# Patient Record
Sex: Female | Born: 1995 | Hispanic: No | Marital: Single | Smoking: Never smoker
Health system: Southern US, Community
[De-identification: ages and names within clinical notes are randomized; demographics above are authoritative.]

## PROBLEM LIST (undated history)

## (undated) DIAGNOSIS — E282 Polycystic ovarian syndrome: Secondary | ICD-10-CM

---

## 2017-11-07 DIAGNOSIS — F4310 Post-traumatic stress disorder, unspecified: Secondary | ICD-10-CM

## 2017-11-07 DIAGNOSIS — F330 Major depressive disorder, recurrent, mild: Secondary | ICD-10-CM

## 2017-11-07 HISTORY — DX: Major depressive disorder, recurrent, mild: F33.0

## 2017-11-07 HISTORY — DX: Post-traumatic stress disorder, unspecified: F43.10

## 2019-11-05 LAB — OB RESULTS CONSOLE HEPATITIS B SURFACE ANTIGEN: Hepatitis B Surface Ag: NEGATIVE

## 2019-11-05 LAB — OB RESULTS CONSOLE HIV ANTIBODY (ROUTINE TESTING): HIV: NONREACTIVE

## 2019-11-05 LAB — OB RESULTS CONSOLE RUBELLA ANTIBODY, IGM: Rubella: IMMUNE

## 2020-01-07 DIAGNOSIS — Z131 Encounter for screening for diabetes mellitus: Secondary | ICD-10-CM | POA: Diagnosis not present

## 2020-01-22 ENCOUNTER — Encounter: Payer: Self-pay | Admitting: Emergency Medicine

## 2020-01-22 ENCOUNTER — Emergency Department: Payer: Medicaid Other

## 2020-01-22 ENCOUNTER — Other Ambulatory Visit: Payer: Self-pay

## 2020-01-22 DIAGNOSIS — Z3A17 17 weeks gestation of pregnancy: Secondary | ICD-10-CM | POA: Diagnosis not present

## 2020-01-22 DIAGNOSIS — Z5321 Procedure and treatment not carried out due to patient leaving prior to being seen by health care provider: Secondary | ICD-10-CM | POA: Insufficient documentation

## 2020-01-22 DIAGNOSIS — O26892 Other specified pregnancy related conditions, second trimester: Secondary | ICD-10-CM | POA: Diagnosis not present

## 2020-01-22 DIAGNOSIS — R1031 Right lower quadrant pain: Secondary | ICD-10-CM | POA: Insufficient documentation

## 2020-01-22 LAB — COMPREHENSIVE METABOLIC PANEL
ALT: 10 U/L (ref 0–44)
AST: 16 U/L (ref 15–41)
Albumin: 3.2 g/dL — ABNORMAL LOW (ref 3.5–5.0)
Alkaline Phosphatase: 45 U/L (ref 38–126)
Anion gap: 6 (ref 5–15)
BUN: 5 mg/dL — ABNORMAL LOW (ref 6–20)
CO2: 24 mmol/L (ref 22–32)
Calcium: 8.7 mg/dL — ABNORMAL LOW (ref 8.9–10.3)
Chloride: 103 mmol/L (ref 98–111)
Creatinine, Ser: 0.43 mg/dL — ABNORMAL LOW (ref 0.44–1.00)
GFR, Estimated: >60 mL/min (ref >60)
Glucose, Bld: 94 mg/dL (ref 70–99)
Potassium: 4.1 mmol/L (ref 3.5–5.1)
Sodium: 133 mmol/L — ABNORMAL LOW (ref 135–145)
Total Bilirubin: 0.5 mg/dL (ref 0.3–1.2)
Total Protein: 7.1 g/dL (ref 6.5–8.1)

## 2020-01-22 LAB — URINALYSIS, COMPLETE (UACMP) WITH MICROSCOPIC
Bacteria, UA: NONE SEEN
Bilirubin Urine: NEGATIVE
Glucose, UA: NEGATIVE mg/dL
Hgb urine dipstick: NEGATIVE
Ketones, ur: NEGATIVE mg/dL
Leukocytes,Ua: NEGATIVE
Nitrite: NEGATIVE
Protein, ur: NEGATIVE mg/dL
Specific Gravity, Urine: 1.016 (ref 1.005–1.030)
pH: 8 (ref 5.0–8.0)

## 2020-01-22 LAB — CBC
HCT: 35.4 % — ABNORMAL LOW (ref 36.0–46.0)
Hemoglobin: 12.2 g/dL (ref 12.0–15.0)
MCH: 31.6 pg (ref 26.0–34.0)
MCHC: 34.5 g/dL (ref 30.0–36.0)
MCV: 91.7 fL (ref 80.0–100.0)
Platelets: 255 10*3/uL (ref 150–400)
RBC: 3.86 MIL/uL — ABNORMAL LOW (ref 3.87–5.11)
RDW: 13.9 % (ref 11.5–15.5)
WBC: 8.8 10*3/uL (ref 4.0–10.5)
nRBC: 0 % (ref 0.0–0.2)

## 2020-01-22 NOTE — ED Triage Notes (Signed)
Pt in with co RLQ pain that started today, pt is [redacted] weeks pregnant but not having any vaginal bleeding or fluid leaking. Pt does have hx of PCOS and was told by OB to increase fluid intake.

## 2020-01-22 NOTE — ED Notes (Signed)
Attempted to obtain FHTs by this nurse and Shirlee More RN without success; OB u/s ordered to verify

## 2020-01-23 ENCOUNTER — Emergency Department
Admission: EM | Admit: 2020-01-23 | Discharge: 2020-01-23 | Disposition: A | Discharge status: Home / Self Care | Payer: Medicaid Other | Attending: Emergency Medicine | Admitting: Emergency Medicine

## 2020-01-23 HISTORY — DX: Polycystic ovarian syndrome: E28.2

## 2020-02-20 DIAGNOSIS — Z3689 Encounter for other specified antenatal screening: Secondary | ICD-10-CM | POA: Diagnosis not present

## 2020-02-20 DIAGNOSIS — Z1379 Encounter for other screening for genetic and chromosomal anomalies: Secondary | ICD-10-CM | POA: Diagnosis not present

## 2020-02-20 DIAGNOSIS — Z3482 Encounter for supervision of other normal pregnancy, second trimester: Secondary | ICD-10-CM | POA: Diagnosis not present

## 2020-03-05 DIAGNOSIS — Z3A24 24 weeks gestation of pregnancy: Secondary | ICD-10-CM | POA: Diagnosis not present

## 2020-03-05 DIAGNOSIS — O26879 Cervical shortening, unspecified trimester: Secondary | ICD-10-CM | POA: Diagnosis not present

## 2020-03-05 DIAGNOSIS — O283 Abnormal ultrasonic finding on antenatal screening of mother: Secondary | ICD-10-CM | POA: Diagnosis not present

## 2020-03-05 DIAGNOSIS — O0992 Supervision of high risk pregnancy, unspecified, second trimester: Secondary | ICD-10-CM | POA: Diagnosis not present

## 2020-03-06 DIAGNOSIS — Z3686 Encounter for antenatal screening for cervical length: Secondary | ICD-10-CM | POA: Diagnosis not present

## 2020-03-07 DIAGNOSIS — O09212 Supervision of pregnancy with history of pre-term labor, second trimester: Secondary | ICD-10-CM | POA: Diagnosis not present

## 2020-03-19 DIAGNOSIS — Z3402 Encounter for supervision of normal first pregnancy, second trimester: Secondary | ICD-10-CM | POA: Diagnosis not present

## 2020-03-19 DIAGNOSIS — Z3A26 26 weeks gestation of pregnancy: Secondary | ICD-10-CM | POA: Diagnosis not present

## 2020-03-26 ENCOUNTER — Observation Stay: Payer: Medicaid Other | Admitting: Anesthesiology

## 2020-03-26 ENCOUNTER — Encounter
Admission: EM | Disposition: A | Discharge status: Home / Self Care | Payer: Self-pay | Source: Home / Self Care | Attending: Obstetrics & Gynecology

## 2020-03-26 ENCOUNTER — Encounter: Payer: Self-pay | Admitting: Obstetrics & Gynecology

## 2020-03-26 ENCOUNTER — Observation Stay: Payer: Medicaid Other

## 2020-03-26 ENCOUNTER — Other Ambulatory Visit: Payer: Self-pay

## 2020-03-26 ENCOUNTER — Inpatient Hospital Stay
Admission: EM | Admit: 2020-03-26 | Discharge: 2020-03-28 | DRG: 786 | Disposition: A | Discharge status: Home / Self Care | Payer: Medicaid Other | Attending: Obstetrics & Gynecology | Admitting: Obstetrics & Gynecology

## 2020-03-26 DIAGNOSIS — O328XX0 Maternal care for other malpresentation of fetus, not applicable or unspecified: Secondary | ICD-10-CM | POA: Diagnosis present

## 2020-03-26 DIAGNOSIS — U071 COVID-19: Secondary | ICD-10-CM

## 2020-03-26 DIAGNOSIS — D62 Acute posthemorrhagic anemia: Secondary | ICD-10-CM | POA: Diagnosis not present

## 2020-03-26 DIAGNOSIS — O9852 Other viral diseases complicating childbirth: Secondary | ICD-10-CM | POA: Diagnosis present

## 2020-03-26 DIAGNOSIS — O9081 Anemia of the puerperium: Secondary | ICD-10-CM | POA: Diagnosis present

## 2020-03-26 DIAGNOSIS — O26872 Cervical shortening, second trimester: Secondary | ICD-10-CM

## 2020-03-26 DIAGNOSIS — Z3A27 27 weeks gestation of pregnancy: Secondary | ICD-10-CM

## 2020-03-26 DIAGNOSIS — O98512 Other viral diseases complicating pregnancy, second trimester: Secondary | ICD-10-CM

## 2020-03-26 DIAGNOSIS — O4692 Antepartum hemorrhage, unspecified, second trimester: Secondary | ICD-10-CM | POA: Diagnosis not present

## 2020-03-26 DIAGNOSIS — O321XX0 Maternal care for breech presentation, not applicable or unspecified: Secondary | ICD-10-CM | POA: Diagnosis not present

## 2020-03-26 DIAGNOSIS — O98513 Other viral diseases complicating pregnancy, third trimester: Secondary | ICD-10-CM | POA: Diagnosis present

## 2020-03-26 DIAGNOSIS — O4592 Premature separation of placenta, unspecified, second trimester: Secondary | ICD-10-CM | POA: Diagnosis not present

## 2020-03-26 DIAGNOSIS — O4693 Antepartum hemorrhage, unspecified, third trimester: Secondary | ICD-10-CM | POA: Diagnosis present

## 2020-03-26 LAB — URINE DRUG SCREEN, QUALITATIVE (ARMC ONLY)
Amphetamines, Ur Screen: NOT DETECTED
Barbiturates, Ur Screen: NOT DETECTED
Benzodiazepine, Ur Scrn: NOT DETECTED
Cannabinoid 50 Ng, Ur ~~LOC~~: NOT DETECTED
Cocaine Metabolite,Ur ~~LOC~~: NOT DETECTED
MDMA (Ecstasy)Ur Screen: NOT DETECTED
Methadone Scn, Ur: NOT DETECTED
Opiate, Ur Screen: NOT DETECTED
Phencyclidine (PCP) Ur S: NOT DETECTED
Tricyclic, Ur Screen: NOT DETECTED

## 2020-03-26 LAB — COMPREHENSIVE METABOLIC PANEL
ALT: 19 U/L (ref 0–44)
AST: 22 U/L (ref 15–41)
Albumin: 2.6 g/dL — ABNORMAL LOW (ref 3.5–5.0)
Alkaline Phosphatase: 63 U/L (ref 38–126)
Anion gap: 11 (ref 5–15)
BUN: <5 mg/dL — ABNORMAL LOW (ref 6–20)
CO2: 19 mmol/L — ABNORMAL LOW (ref 22–32)
Calcium: 8.2 mg/dL — ABNORMAL LOW (ref 8.9–10.3)
Chloride: 103 mmol/L (ref 98–111)
Creatinine, Ser: 0.5 mg/dL (ref 0.44–1.00)
GFR, Estimated: >60 mL/min (ref >60)
Glucose, Bld: 95 mg/dL (ref 70–99)
Potassium: 3.2 mmol/L — ABNORMAL LOW (ref 3.5–5.1)
Sodium: 133 mmol/L — ABNORMAL LOW (ref 135–145)
Total Bilirubin: 0.8 mg/dL (ref 0.3–1.2)
Total Protein: 6.5 g/dL (ref 6.5–8.1)

## 2020-03-26 LAB — ABO/RH: ABO/RH(D): A POS

## 2020-03-26 LAB — CBC
HCT: 29.3 % — ABNORMAL LOW (ref 36.0–46.0)
Hemoglobin: 9.6 g/dL — ABNORMAL LOW (ref 12.0–15.0)
MCH: 30 pg (ref 26.0–34.0)
MCHC: 32.8 g/dL (ref 30.0–36.0)
MCV: 91.6 fL (ref 80.0–100.0)
Platelets: 194 10*3/uL (ref 150–400)
RBC: 3.2 MIL/uL — ABNORMAL LOW (ref 3.87–5.11)
RDW: 13.9 % (ref 11.5–15.5)
WBC: 9.3 10*3/uL (ref 4.0–10.5)
nRBC: 0 % (ref 0.0–0.2)

## 2020-03-26 LAB — CHLAMYDIA/NGC RT PCR (ARMC ONLY)
Chlamydia Tr: NOT DETECTED
N gonorrhoeae: NOT DETECTED

## 2020-03-26 LAB — RESP PANEL BY RT-PCR (FLU A&B, COVID) ARPGX2
Influenza A by PCR: NEGATIVE
Influenza B by PCR: NEGATIVE
SARS Coronavirus 2 by RT PCR: POSITIVE — AB

## 2020-03-26 LAB — WET PREP, GENITAL
Clue Cells Wet Prep HPF POC: NONE SEEN
Sperm: NONE SEEN
Trich, Wet Prep: NONE SEEN
Yeast Wet Prep HPF POC: NONE SEEN

## 2020-03-26 LAB — TYPE AND SCREEN
ABO/RH(D): A POS
Antibody Screen: NEGATIVE

## 2020-03-26 SURGERY — Surgical Case
Anesthesia: Spinal

## 2020-03-26 MED ORDER — SOD CITRATE-CITRIC ACID 500-334 MG/5ML PO SOLN
ORAL | Status: AC
  Administered 2020-03-26: 30 mL
  Filled 2020-03-26: qty 15

## 2020-03-26 MED ORDER — OXYCODONE HCL 5 MG PO TABS
5.0000 mg | ORAL_TABLET | ORAL | Status: DC | PRN
Start: 2020-03-26 — End: 2020-03-28
  Administered 2020-03-26 (×2): 5 mg via ORAL
  Administered 2020-03-27: 10 mg via ORAL
  Filled 2020-03-26: qty 1
  Filled 2020-03-26 (×3): qty 2

## 2020-03-26 MED ORDER — SODIUM CHLORIDE 0.9% FLUSH: 3.0000 mL | INTRAVENOUS | Status: DC | PRN

## 2020-03-26 MED ORDER — DIBUCAINE (PERIANAL) 1 % EX OINT
1.0000 | TOPICAL_OINTMENT | CUTANEOUS | Status: DC | PRN
Start: 2020-03-26 — End: 2020-03-28

## 2020-03-26 MED ORDER — SENNOSIDES-DOCUSATE SODIUM 8.6-50 MG PO TABS
2.0000 | ORAL_TABLET | ORAL | Status: DC
  Administered 2020-03-26: 2 via ORAL
  Filled 2020-03-26 (×2): qty 2

## 2020-03-26 MED ORDER — LACTATED RINGERS IV SOLN: INTRAVENOUS | Status: DC | PRN

## 2020-03-26 MED ORDER — FENTANYL CITRATE (PF) 100 MCG/2ML IJ SOLN
INTRAMUSCULAR | Status: DC | PRN
  Administered 2020-03-26 (×2): 50 ug via INTRAVENOUS

## 2020-03-26 MED ORDER — ACETAMINOPHEN 500 MG PO TABS
ORAL_TABLET | ORAL | Status: AC
  Filled 2020-03-26: qty 2

## 2020-03-26 MED ORDER — WITCH HAZEL-GLYCERIN EX PADS: 1.0000 "application " | MEDICATED_PAD | CUTANEOUS | Status: DC | PRN

## 2020-03-26 MED ORDER — OXYTOCIN-SODIUM CHLORIDE 30-0.9 UT/500ML-% IV SOLN
INTRAVENOUS | Status: DC | PRN
  Administered 2020-03-26: 600 mL/h via INTRAVENOUS

## 2020-03-26 MED ORDER — KETOROLAC TROMETHAMINE 30 MG/ML IJ SOLN
INTRAMUSCULAR | Status: AC
  Filled 2020-03-26: qty 1

## 2020-03-26 MED ORDER — CARBOPROST TROMETHAMINE 250 MCG/ML IM SOLN
INTRAMUSCULAR | Status: AC
  Filled 2020-03-26: qty 1

## 2020-03-26 MED ORDER — MAGNESIUM SULFATE BOLUS VIA INFUSION
4.0000 g | Freq: Once | INTRAVENOUS | Status: AC
  Administered 2020-03-26: 4 g via INTRAVENOUS
  Filled 2020-03-26: qty 1000

## 2020-03-26 MED ORDER — IBUPROFEN 600 MG PO TABS
600.0000 mg | ORAL_TABLET | Freq: Four times a day (QID) | ORAL | Status: DC
  Administered 2020-03-26 – 2020-03-28 (×7): 600 mg via ORAL
  Filled 2020-03-26 (×6): qty 1

## 2020-03-26 MED ORDER — SODIUM CHLORIDE 0.9 % IV SOLN: 250.0000 mL | INTRAVENOUS | Status: DC

## 2020-03-26 MED ORDER — SODIUM CHLORIDE (PF) 0.9 % IJ SOLN
INTRAMUSCULAR | Status: AC
  Filled 2020-03-26: qty 50

## 2020-03-26 MED ORDER — KETOROLAC TROMETHAMINE 30 MG/ML IJ SOLN
INTRAMUSCULAR | Status: DC | PRN
  Administered 2020-03-26: 30 mg via INTRAVENOUS

## 2020-03-26 MED ORDER — ONDANSETRON HCL 4 MG/2ML IJ SOLN
INTRAMUSCULAR | Status: AC
  Filled 2020-03-26: qty 2

## 2020-03-26 MED ORDER — FENTANYL CITRATE (PF) 100 MCG/2ML IJ SOLN: 25.0000 ug | INTRAMUSCULAR | Status: DC | PRN

## 2020-03-26 MED ORDER — IBUPROFEN 600 MG PO TABS
ORAL_TABLET | ORAL | Status: AC
  Filled 2020-03-26: qty 1

## 2020-03-26 MED ORDER — MENTHOL 3 MG MT LOZG
1.0000 | LOZENGE | OROMUCOSAL | Status: DC | PRN
  Filled 2020-03-26: qty 9

## 2020-03-26 MED ORDER — ACETAMINOPHEN 325 MG PO TABS
650.0000 mg | ORAL_TABLET | ORAL | Status: DC | PRN
  Administered 2020-03-26: 650 mg via ORAL
  Filled 2020-03-26: qty 2

## 2020-03-26 MED ORDER — CALCIUM CARBONATE ANTACID 500 MG PO CHEW: 2.0000 | CHEWABLE_TABLET | ORAL | Status: DC | PRN

## 2020-03-26 MED ORDER — TRANEXAMIC ACID 1000 MG/10ML IV SOLN
INTRAVENOUS | Status: AC
  Filled 2020-03-26: qty 10

## 2020-03-26 MED ORDER — TERBUTALINE SULFATE 1 MG/ML IJ SOLN
INTRAMUSCULAR | Status: AC
  Administered 2020-03-26: 1 mg
  Filled 2020-03-26: qty 1

## 2020-03-26 MED ORDER — DIPHENHYDRAMINE HCL 25 MG PO CAPS: 25.0000 mg | ORAL_CAPSULE | Freq: Four times a day (QID) | ORAL | Status: DC | PRN

## 2020-03-26 MED ORDER — LACTATED RINGERS IV SOLN: INTRAVENOUS | Status: DC

## 2020-03-26 MED ORDER — ONDANSETRON HCL 4 MG/2ML IJ SOLN
INTRAMUSCULAR | Status: DC | PRN
  Administered 2020-03-26: 4 mg via INTRAVENOUS

## 2020-03-26 MED ORDER — SODIUM CHLORIDE FLUSH 0.9 % IV SOLN
INTRAVENOUS | Status: DC | PRN
  Administered 2020-03-26: 100 mL

## 2020-03-26 MED ORDER — FENTANYL CITRATE (PF) 100 MCG/2ML IJ SOLN
INTRAMUSCULAR | Status: AC
  Filled 2020-03-26: qty 2

## 2020-03-26 MED ORDER — ONDANSETRON HCL 4 MG/2ML IJ SOLN: 4.0000 mg | Freq: Once | INTRAMUSCULAR | Status: DC | PRN

## 2020-03-26 MED ORDER — BUPIVACAINE IN DEXTROSE 0.75-8.25 % IT SOLN
INTRATHECAL | Status: DC | PRN
  Administered 2020-03-26: 1.6 mL via INTRATHECAL

## 2020-03-26 MED ORDER — OXYTOCIN-SODIUM CHLORIDE 30-0.9 UT/500ML-% IV SOLN
INTRAVENOUS | Status: AC
  Filled 2020-03-26: qty 500

## 2020-03-26 MED ORDER — SODIUM CHLORIDE 0.9% FLUSH
3.0000 mL | Freq: Two times a day (BID) | INTRAVENOUS | Status: DC
  Administered 2020-03-26: 3 mL via INTRAVENOUS

## 2020-03-26 MED ORDER — CEFAZOLIN SODIUM-DEXTROSE 2-4 GM/100ML-% IV SOLN
INTRAVENOUS | Status: AC
  Filled 2020-03-26: qty 100

## 2020-03-26 MED ORDER — LACTATED RINGERS IV BOLUS
1000.0000 mL | Freq: Once | INTRAVENOUS | Status: AC
  Administered 2020-03-26: 1000 mL via INTRAVENOUS

## 2020-03-26 MED ORDER — PHENYLEPHRINE HCL (PRESSORS) 10 MG/ML IV SOLN
INTRAVENOUS | Status: DC | PRN
  Administered 2020-03-26: 50 ug via INTRAVENOUS

## 2020-03-26 MED ORDER — OXYTOCIN-SODIUM CHLORIDE 30-0.9 UT/500ML-% IV SOLN
2.5000 [IU]/h | INTRAVENOUS | Status: AC
  Administered 2020-03-26: 2.5 [IU]/h via INTRAVENOUS
  Filled 2020-03-26: qty 500

## 2020-03-26 MED ORDER — BUPIVACAINE LIPOSOME 1.3 % IJ SUSP
INTRAMUSCULAR | Status: AC
  Filled 2020-03-26: qty 20

## 2020-03-26 MED ORDER — CEFAZOLIN SODIUM-DEXTROSE 2-3 GM-%(50ML) IV SOLR
INTRAVENOUS | Status: DC | PRN
  Administered 2020-03-26: 2 g via INTRAVENOUS

## 2020-03-26 MED ORDER — MAGNESIUM SULFATE 40 GM/1000ML IV SOLN
2.0000 g/h | INTRAVENOUS | Status: DC
  Filled 2020-03-26: qty 1000

## 2020-03-26 MED ORDER — ACETAMINOPHEN 500 MG PO TABS
1000.0000 mg | ORAL_TABLET | Freq: Four times a day (QID) | ORAL | Status: DC
  Administered 2020-03-26 – 2020-03-28 (×7): 1000 mg via ORAL
  Filled 2020-03-26 (×6): qty 2

## 2020-03-26 MED ORDER — METHYLERGONOVINE MALEATE 0.2 MG/ML IJ SOLN
INTRAMUSCULAR | Status: AC
  Filled 2020-03-26: qty 1

## 2020-03-26 MED ORDER — SOD CITRATE-CITRIC ACID 500-334 MG/5ML PO SOLN: 30.0000 mL | Freq: Once | ORAL | Status: DC

## 2020-03-26 MED ORDER — OXYTOCIN-SODIUM CHLORIDE 30-0.9 UT/500ML-% IV SOLN
INTRAVENOUS | Status: AC
  Administered 2020-03-27: 2.5 [IU]/h via INTRAVENOUS
  Filled 2020-03-26: qty 1000

## 2020-03-26 MED ORDER — COCONUT OIL OIL
1.0000 "application " | TOPICAL_OIL | Status: DC | PRN
  Administered 2020-03-27: 1 via TOPICAL
  Filled 2020-03-26: qty 120

## 2020-03-26 MED ORDER — BUPIVACAINE HCL (PF) 0.5 % IJ SOLN
INTRAMUSCULAR | Status: AC
  Filled 2020-03-26: qty 30

## 2020-03-26 MED ORDER — OXYCODONE HCL 5 MG PO TABS
ORAL_TABLET | ORAL | Status: AC
  Administered 2020-03-27: 10 mg via ORAL
  Filled 2020-03-26: qty 1

## 2020-03-26 MED ORDER — BETAMETHASONE SOD PHOS & ACET 6 (3-3) MG/ML IJ SUSP
12.0000 mg | INTRAMUSCULAR | Status: DC
  Filled 2020-03-26: qty 5

## 2020-03-26 MED ORDER — PRENATAL MULTIVITAMIN CH
1.0000 | ORAL_TABLET | Freq: Every day | ORAL | Status: DC
  Administered 2020-03-28: 1 via ORAL
  Filled 2020-03-26 (×3): qty 1

## 2020-03-26 MED ORDER — SIMETHICONE 80 MG PO CHEW
160.0000 mg | CHEWABLE_TABLET | Freq: Four times a day (QID) | ORAL | Status: DC | PRN
  Administered 2020-03-27 (×2): 160 mg via ORAL
  Filled 2020-03-26 (×2): qty 2

## 2020-03-26 SURGICAL SUPPLY — 39 items
BINDER ABD UNIV 9 30-45 (GAUZE/BANDAGES/DRESSINGS) ×1 IMPLANT
BINDER ABDOMINAL 9 (GAUZE/BANDAGES/DRESSINGS) ×2
CANISTER SUCT 3000ML PPV (MISCELLANEOUS) ×2 IMPLANT
COVER WAND RF STERILE (DRAPES) ×2 IMPLANT
DERMABOND ADHESIVE PROPEN (GAUZE/BANDAGES/DRESSINGS) ×1
DERMABOND ADVANCED (GAUZE/BANDAGES/DRESSINGS) ×1
DERMABOND ADVANCED .7 DNX12 (GAUZE/BANDAGES/DRESSINGS) ×1 IMPLANT
DERMABOND ADVANCED .7 DNX6 (GAUZE/BANDAGES/DRESSINGS) ×1 IMPLANT
DRSG TELFA 3X8 NADH (GAUZE/BANDAGES/DRESSINGS) ×2 IMPLANT
ELECT CAUTERY BLADE 6.4 (BLADE) ×2 IMPLANT
ELECT REM PT RETURN 9FT ADLT (ELECTROSURGICAL) ×2
ELECTRODE REM PT RTRN 9FT ADLT (ELECTROSURGICAL) ×1 IMPLANT
GAUZE PACKING IODOFORM 2 (PACKING) ×2 IMPLANT
GAUZE SPONGE 4X4 12PLY STRL (GAUZE/BANDAGES/DRESSINGS) ×2 IMPLANT
GLOVE PI ORTHOPRO 6.5 (GLOVE) ×1
GLOVE PI ORTHOPRO STRL 6.5 (GLOVE) ×1 IMPLANT
GLOVE SURG SYN 6.5 ES PF (GLOVE) ×2 IMPLANT
GOWN STRL REUS W/ TWL LRG LVL3 (GOWN DISPOSABLE) ×3 IMPLANT
GOWN STRL REUS W/TWL LRG LVL3 (GOWN DISPOSABLE) ×3
MANIFOLD NEPTUNE II (INSTRUMENTS) ×2 IMPLANT
NS IRRIG 1000ML POUR BTL (IV SOLUTION) ×2 IMPLANT
PACK C SECTION AR (MISCELLANEOUS) ×2 IMPLANT
PAD OB MATERNITY 4.3X12.25 (PERSONAL CARE ITEMS) ×2 IMPLANT
PAD PREP 24X41 OB/GYN DISP (PERSONAL CARE ITEMS) ×2 IMPLANT
PENCIL SMOKE ULTRAEVAC 22 CON (MISCELLANEOUS) ×2 IMPLANT
STRAP SAFETY 5IN WIDE (MISCELLANEOUS) ×2 IMPLANT
STRIP CLOSURE SKIN 1/2X4 (GAUZE/BANDAGES/DRESSINGS) ×2 IMPLANT
SUT MNCRL 4-0 (SUTURE) ×2
SUT MNCRL 4-0 27XMFL (SUTURE) ×2
SUT PDS AB 1 TP1 96 (SUTURE) ×2 IMPLANT
SUT VIC AB 0 CT1 36 (SUTURE) ×6 IMPLANT
SUT VIC AB 0 CTX 36 (SUTURE) ×3
SUT VIC AB 0 CTX36XBRD ANBCTRL (SUTURE) ×3 IMPLANT
SUT VIC AB 2-0 CT1 27 (SUTURE) ×1
SUT VIC AB 2-0 CT1 TAPERPNT 27 (SUTURE) ×1 IMPLANT
SUT VIC AB 3-0 SH 27 (SUTURE) ×2
SUT VIC AB 3-0 SH 27X BRD (SUTURE) ×2 IMPLANT
SUTURE MNCRL 4-0 27XMF (SUTURE) ×2 IMPLANT
SWABSTK COMLB BENZOIN TINCTURE (MISCELLANEOUS) ×2 IMPLANT

## 2020-03-26 NOTE — Progress Notes (Addendum)
Late entry from earlier events:  I received sign out from Margaretmary Eddy, [place].  She had initially evaluated this patient who was traveling from out of town.  She had been previously diagnosed with a short cervix, and was to have discussion about rescue cerclage at her next prenatal visit.   She noted significant vaginal bleeding today, and denied intercourse or any vigorous pelvic activity.  She was initially examined and found to be 2cm and was started on magnesium sulfate for neuroprotection.  BMZ had been given 20 days ago over 2 doses.    She was found to be covid positive.  We were in discussion for possible transfer to a tertiary care facility or a level 3 NICU during the conclusion of her ultrasound which, per sonographer showed anterior placenta, breech presentation, and no visible cervix.  This was confirmed on SVE.  Conversation was abandoned due to stability, and stat vs emergent cesarean was called.  Terbutaline was given.  Risks of cesarean vs risks of vaginal delivery for breech were reviewed.  Patient agreed to cesarean.  She was most concerned about not being able to see her baby before she was transferred to a Level 3 NICU.  We will hopefully find a way to accommodate this request.  Operative teams were assembled, and orders were placed.    We proceeded to the OR.  See op note.   ----- Ranae Plumber, MD, FACOG Attending Obstetrician and Gynecologist [facility], Department of OB/GYN [facility]

## 2020-03-26 NOTE — Anesthesia Procedure Notes (Signed)
Spinal  End time: 03/26/2020 6:46 PM Staffing Performed: resident/CRNA  Anesthesiologist: Naomie Dean, MD Resident/CRNA: Omer Jack, CRNA Preanesthetic Checklist Completed: patient identified, IV checked, site marked, risks and benefits discussed, surgical consent, monitors and equipment checked, pre-op evaluation and timeout performed Spinal Block Patient position: sitting Prep: ChloraPrep Patient monitoring: heart rate, continuous pulse ox and blood pressure Approach: midline Location: L3-4 Injection technique: single-shot Needle Needle type: Whitacre  Needle gauge: 22 G Needle length: 9 cm Assessment Sensory level: T6 Events: paresthesia

## 2020-03-26 NOTE — Discharge Summary (Signed)
Obstetrical Discharge Summary  Patient Name: Kim Moody DOB: [birth year] MRN: [id number]  Date of Admission: 03/26/2020 Date of Delivery: 03/26/2020 Delivered by: Ranae Plumber, MD Date of Discharge: 03/28/2020  Primary OB: [facility] [place]  LMP: unknown, calculated as 09/17/19 EDC Estimated Date of Delivery: 06/23/20 Gestational Age at Delivery: [redacted]w[redacted]d   Antepartum complications:  1. covid + 2. Short cervix 3. Placental abruption 4. Preterm labor   Admitting Diagnosis:  covid +, vaginal bleeding, preterm labor Secondary Diagnosis: Patient Active Problem List   Diagnosis Date Noted  . Vaginal bleeding in pregnancy, third trimester 03/26/2020  . COVID-19 affecting pregnancy in third trimester 03/26/2020  . Preterm labor 03/26/2020  . Indication for care in labor or delivery 03/26/2020  . Vaginal bleeding in pregnancy, second trimester     Augmentation: n/a Complications: Placental Abruption Intrapartum complications/course: see op note Delivery Type: primary cesarean section, low transverse incision - with double layer closure. LUS was well-developed.   Anesthesia: spinal Placenta: spontaneous, with large clots in fluid prior to delivery of placenta consistent with abruption Laceration: n/a  Episiotomy: n/a Newborn Data: Live born female "Naelani" Birth Weight: 2 lb 2.9 oz (990 g) APGAR: 4, 9  Newborn Delivery   Birth date/time: 03/26/2020 18:59:00 Delivery type: C-Section, Low Transverse Trial of labor: No C-section categorization: Primary     Postpartum Procedures: none  Edinburgh:  Edinburgh Postnatal Depression Scale Screening Tool 03/27/2020  I have been able to laugh and see the funny side of things. 0  I have looked forward with enjoyment to things. 0  I have blamed myself unnecessarily when things went wrong. 2  I have been anxious or worried for no good reason. 2  I have felt scared or panicky for no good  reason. 2  Things have been getting on top of me. 1  I have been so unhappy that I have had difficulty sleeping. 1  I have felt sad or miserable. 2  I have been so unhappy that I have been crying. 1  The thought of harming myself has occurred to me. 1  Edinburgh Postnatal Depression Scale Total 12     Post partum course:  Patient had an uncomplicated postpartum course.  By time of discharge on POD#2, her pain was controlled on oral pain medications; she had appropriate lochia and was ambulating, voiding without difficulty, tolerating regular diet and passing flatus.   Transition of care team was consulted for elevated EPDS score and history of depression.  Resources and warning signs reviewed.  She was deemed stable for discharge to home.  Plan to have close follow up postpartum to assess for postpartum depression.   Discharge Physical Exam:  BP 102/69   Pulse 75   Temp 98 F (36.7 C) (Oral)   Resp 20   Ht 5\' 1"  (1.549 m)   Wt 63.5 kg   SpO2 99%   Breastfeeding Unknown   BMI 26.45 kg/m   General: NAD CV: RRR Pulm: CTABL, nl effort ABD: s/nd/nt, fundus firm and below the umbilicus Lochia: moderate Incision: c/d/I DVT Evaluation: LE non-ttp, no evidence of DVT on exam.  Hemoglobin  Date Value Ref Range Status  03/27/2020 9.0 (L) 12.0 - 15.0 g/dL Final   HCT  Date Value Ref Range Status  03/27/2020 26.9 (L) 36.0 - 46.0 % Final     Disposition: stable, discharge to home. Baby Feeding: breastmilk Baby Disposition: transferred to [facility]   Rh Immune globulin  given:  Rh Pos Rubella vaccine given:  Immune    Contraception: TBD  Prenatal Labs:  Blood type/Rh Pos  Antibody screen neg  Rubella Immune  Varicella Not done   RPR NR  HBsAg NR  HIV NR   GC neg  Chlamydia neg  Genetic screening Unknown   1 hour GTT WNL  3 hour GTT N/A  GBS Not done       Plan:  Addilyne Backs was discharged home in good condition. Follow-up appointment  with Dr. Elesa Massed for postop check in 2 weeks.  Discharge Medications: Allergies as of 03/28/2020   No Known Allergies     Medication List    TAKE these medications   acetaminophen 500 MG tablet Commonly known as: TYLENOL Take 2 tablets (1,000 mg total) by mouth every 6 (six) hours as needed.   ibuprofen 600 MG tablet Commonly known as: ADVIL Take 1 tablet (600 mg total) by mouth every 6 (six) hours as needed for fever or headache.   oxyCODONE 5 MG immediate release tablet Commonly known as: Oxy IR/ROXICODONE Take 1-2 tablets (5-10 mg total) by mouth every 4 (four) hours as needed for moderate pain.   prenatal multivitamin Tabs tablet Take 1 tablet by mouth daily at 12 noon.        Follow-up Information    Ward, Elenora Fender, MD. Schedule an appointment as soon as possible for a visit in 2 week(s).   Specialty: Obstetrics and Gynecology Why: post-op incision and mood check  Contact information: [address] [place] [address] [phone]               Signed:  Margaretmary Eddy, CNM Certified Nurse Midwife [place]  [facility]

## 2020-03-26 NOTE — Progress Notes (Signed)
Patient reports that she received her BMZ from [facility] a week ago.

## 2020-03-26 NOTE — Op Note (Addendum)
Cesarean Section Procedure Note  03/26/2020   Patient:  Kim Moody  24 y.o. female at [redacted]w[redacted]d.  No LMP recorded. Patient is pregnant. Preoperative diagnosis:   vaginal bleeding, preterm labor, breech, 27 weeks pregnancy Postoperative diagnosis:  Placental abruption, preterm labor, breech, 27 weeks pregnancy  PROCEDURE:  Procedure(s): CESAREAN SECTION (N/A) Surgeon:  Surgeon(s) and Role:    * Tallon Gertz, Elenora Fender, MD - Primary Assisting:  Hildred Laser, MD Anesthesia:  spinal I/O: Total I/O In: 800 [I.V.:800] Out: 640 [Urine:200; Blood:440] Specimens:  Cord gas, placenta Complications: None Apparent Disposition:  VS stable to PACU  Findings:  Cervix dilated to 9+cm with suspicion of placenta protruding Double footling breech Nuchal cord x1 normal uterus, tubes and ovaries bilaterally Large blood clot within uterine cavity Live born female "Naelani" Birth Weight: 2 lb 2.9 oz (990 g) APGAR: 4, 9  Newborn Delivery   Birth date/time: 03/26/2020 18:59:00 Delivery type: C-Section, Low Transverse Trial of labor: No C-section categorization: Primary       Indication for procedure: 24 y.o. female at [redacted]w[redacted]d who is traveling from out of town, and was previously diagnosed with a short cervix, given betamethasone Dec 9 x 2 days. She presented to our hospital with vaginal bleeding and contractions. Her cervix was initially 2cm and she was started on magnesium sulfate for neuroprotection. Ultrasound was performed that showed no visible cervix, and breech position.  On next cervical exam she had a rim of cervix and what felt like a portion of the placenta posteriorly against intact membranes.  Emergent cesarean was called, terbutaline given.  Procedure Details   The risks, benefits, complications, treatment options, and expected outcomes were discussed with the patient. Informed consent was obtained. The patient was taken to Operating Room, identified as Kim Moody and the procedure verified  as a cesarean delivery.   After administration of anesthesia, the patient was prepped and draped in the usual sterile manner.. A surgical time out was performed, with the pediatric team present. After confirming adequate anesthesia, a Pfannenstiel incision was made and carried down through the subcutaneous tissue to the fascia. Fascial incision was made and extended transversely. The fascia was separated from the underlying rectus tissue superiorly and inferiorly. The rectus muscles were divided in the midline. The peritoneum was identified and entered. Peritoneal incision was extended longitudinally. The lower uterine segment was well-developed.  A low transverse uterine incision was made. A large well-formed blood clot delivered immediately through the hysterotomy upon entry.  Manson Passey clear fluid was encountered.  Delivered from double footling breech presentation was a live born female. Delayed cord clamping was not intentionally performed due to no tone and no pulse palpated on delivery. Chest compressions were performed as the umbilical cord was doubly clamped and cut, and the baby was handed off to the awaitng pediatrician.  A section of the umbilical cord blood was obtained for blood gasses. The placenta was removed intact and appeared normal. The uterus was delivered from the abdominal cavity and cleared of clots, membranes, and debris. The uterus, tubes and ovaries appeared normal. The uterine incision was closed with running locking sutures of 0 Vicryl, and then a second, imbricating stitch was placed. Hemostasis was observed. The abdominal cavity was evacuated of extraneous fluid. The uterus was returned to the abdominal cavity and again the incision was inspected for hemostasis, which was confirmed.  The paracolic gutters were cleaned. The fascia was then reapproximated with running suture of vicryl. 90cc of Long- and short-acting bupivicaine was injected circumferentially  into the fascia.  After a  change of gloves, the subcutaneous tissue was irrigated and reapproximated with 3-0 vicryl. The skin was closed with 4-0 Monocryl and 10cc of long- and short-acting bupivacaine injected into the skin and subcutaneous tissues.  The incision was covered with surgical glue. An abdominal binder was placed.   Instrument, sponge, and needle counts were correct prior the abdominal closure and at the conclusion of the case.   I was present and performed this procedure in its entirety.  VTE: SCDs Perioperative antibiotics: Ancef 2g  No other capable assistant was available for this surgery which requires an experienced, high level assistant.  Dr. Valentino Saxon provided exposure, dissection, suctioning, retraction, and general support and assistance during the procedure.   ----- Ranae Plumber, MD Attending Obstetrician and Gynecologist [facility], Department of OB/GYN [facility]

## 2020-03-26 NOTE — H&P (Signed)
OB History & Physical   History of Present Illness:  Chief Complaint:   HPI:  Zenab Gronewold is a 24 y.o. G1P0 female at [redacted]w[redacted]d dated by patient's report of EDC.  She presents to L&D for vaginal bleeding   Reports active fetal movement  Contractions: every 3 to 5 minutes LOF/SROM: denies  Vaginal bleeding: dark red vaginal bleeding   Pregnancy Issues: 1. Short cervix in pregnancy  2. Covid-19 positive in pregnancy  3. Receives prenatal care with [facility] [place], incomplete records available   Patient Active Problem List   Diagnosis Date Noted  . Vaginal bleeding in pregnancy, third trimester 03/26/2020  . COVID-19 affecting pregnancy in third trimester 03/26/2020  . PTSD (post-traumatic stress disorder) 11/07/2017  . 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 PCOS (polycystic ovarian syndrome)     History reviewed. No pertinent surgical history.  No Known Allergies  Prior to Admission medications   Not on File     Prenatal care site:  [facility]  Social History:  reports that she has never smoked. She does not have any smokeless tobacco history on file. She reports previous alcohol use. She reports that she does not use drugs.  Family History: No pertinent family history, no history of gyn cancers    Review of Systems: A full review of systems was performed and negative except as noted in the HPI.     Physical Exam:  Vital Signs: BP (!) 95/52 (BP Location: Left Arm)   Pulse (!) 130   Temp (!) 100.5 F (38.1 C) (Oral)   Resp 18   SpO2 96%  Physical Exam  General: no acute distress.  HEENT: normocephalic, atraumatic Heart: regular rate & rhythm.  No murmurs/rubs/gallops Lungs: clear to auscultation bilaterally, normal respiratory effort Abdomen: soft, gravid, non-tender;   Pelvic:   External:  Normal external female genitalia  Vaginal: moderate amount of dark, red blood   Cervix: Dilation: 2 / Effacement (%): 60 / Station: -3    Extremities: non-tender, symmetric, No edema bilaterally.  DTRs: 2+/2+  Neurologic: Alert & oriented x 3.    Results for orders placed or performed during the hospital encounter of 03/26/20 (from the past 24 hour(s))  Resp Panel by RT-PCR (Flu A&B, Covid) Nasopharyngeal Swab     Status: Abnormal   Collection Time: 03/26/20  3:36 PM   Specimen: Nasopharyngeal Swab; Nasopharyngeal(NP) swabs in vial transport medium  Result Value Ref Range   SARS Coronavirus 2 by RT PCR POSITIVE (A) NEGATIVE   Influenza A by PCR NEGATIVE NEGATIVE   Influenza B by PCR NEGATIVE NEGATIVE  Wet prep, genital     Status: Abnormal   Collection Time: 03/26/20  4:29 PM   Specimen: Vaginal  Result Value Ref Range   Yeast Wet Prep HPF POC NONE SEEN NONE SEEN   Trich, Wet Prep NONE SEEN NONE SEEN   Clue Cells Wet Prep HPF POC NONE SEEN NONE SEEN   WBC, Wet Prep HPF POC RARE (A) NONE SEEN   Sperm NONE SEEN     Pertinent Results:  Prenatal Labs: Blood type/Rh Pending   Antibody screen neg  Rubella Immune  Varicella Not done   RPR Results not available  HBsAg Results not available  HIV Results not available   GC neg  Chlamydia neg  Genetic screening Unknown  1 hour GTT WNL  3 hour GTT N/A  GBS Not done    FHT: Baseline: 165 bpm, Variability: moderate, Accelerations: absent and Decelerations: Absent TOCO: regular, every 3-4 minutes SVE:  Dilation: 2 / Effacement (%): 60 / Station: -3    Breech by SVE   No results found.  Assessment:  Rosaland Shiffman is a 24 y.o. G1P0 female at [redacted]w[redacted]d with preterm labor and vaginal bleeding in 3rd trimester .   Plan:  1. Admit to Labor & Delivery; consents reviewed and obtained - Covid admission screen Positive  - Dr. Elesa Massed notified of admission   2. Fetal Well being  - Fetal Tracing: cat 2 for fetal tachycardia  - Presentation:  breech confirmed by SVE   3. Routine OB: - Prenatal labs reviewed, as above - Rh pending  - CBC, T&S, RPR on admit - Clear fluids, IVF  4. Monitoring of preterm labor  - Contractions monitored with external toco - STAT US ordered for presentation, placenta, cervical length  - Will start mag sulfate for neuroprophylaxis   - Betamethasone ordered but Candis states that she received betamethasone previously.  Will confirm dating.   - consider starting IP antibiotics for preterm labor  - consider transport to tertiary center as long Nylia remains stable - discussed that if we are unable to transport prior to delivery that infant would have to transported after  -Discussed that a primary c/section would be recommended for breech presentation if her cervix continued to dilate     Gustavo Lah, CNM 03/26/20 5:13 PM  Margaretmary Eddy, CNM Certified Nurse Midwife [place]  [facility]

## 2020-03-26 NOTE — Transfer of Care (Signed)
Immediate Anesthesia Transfer of Care Note  Patient: Kim Moody  Procedure(s) Performed: CESAREAN SECTION (N/A )  Patient Location: Mother/Baby  Anesthesia Type:Spinal  Level of Consciousness: drowsy and patient cooperative  Airway & Oxygen Therapy: Patient Spontanous Breathing  Post-op Assessment: Report given to RN and Post -op Vital signs reviewed and stable  Post vital signs: Reviewed and stable  Last Vitals:  Vitals Value Taken Time  BP 114/63 03/26/20 1951  Temp    Pulse    Resp 20 03/26/20 1951  SpO2 97 % 03/26/20 1951    Last Pain:  Vitals:   03/26/20 1824  TempSrc: Oral         Complications: No complications documented.

## 2020-03-26 NOTE — Anesthesia Preprocedure Evaluation (Signed)
Anesthesia Evaluation  Patient identified by MRN, date of birth, ID band Patient awake    Reviewed: Allergy & Precautions, NPO status , Patient's Chart, lab work & pertinent test results  History of Anesthesia Complications Negative for: history of anesthetic complications  Airway Mallampati: II       Dental   Pulmonary neg sleep apnea, neg COPD, Not current smoker,  COVID +          Cardiovascular (-) hypertension(-) Past MI and (-) CHF (-) dysrhythmias (-) Valvular Problems/Murmurs     Neuro/Psych neg Seizures Anxiety Depression    GI/Hepatic Neg liver ROS,   Endo/Other  neg diabetes  Renal/GU negative Renal ROS     Musculoskeletal   Abdominal   Peds  Hematology   Anesthesia Other Findings   Reproductive/Obstetrics (+) Pregnancy                             Anesthesia Physical Anesthesia Plan  ASA: II and emergent  Anesthesia Plan: Spinal   Post-op Pain Management:    Induction:   PONV Risk Score and Plan:   Airway Management Planned:   Additional Equipment:   Intra-op Plan:   Post-operative Plan:   Informed Consent: I have reviewed the patients History and Physical, chart, labs and discussed the procedure including the risks, benefits and alternatives for the proposed anesthesia with the patient or authorized representative who has indicated his/her understanding and acceptance.       Plan Discussed with:   Anesthesia Plan Comments:         Anesthesia Quick Evaluation

## 2020-03-27 DIAGNOSIS — O9081 Anemia of the puerperium: Secondary | ICD-10-CM | POA: Diagnosis not present

## 2020-03-27 DIAGNOSIS — O4692 Antepartum hemorrhage, unspecified, second trimester: Secondary | ICD-10-CM | POA: Diagnosis not present

## 2020-03-27 LAB — CBC
HCT: 26.9 % — ABNORMAL LOW (ref 36.0–46.0)
Hemoglobin: 9 g/dL — ABNORMAL LOW (ref 12.0–15.0)
MCH: 30.2 pg (ref 26.0–34.0)
MCHC: 33.5 g/dL (ref 30.0–36.0)
MCV: 90.3 fL (ref 80.0–100.0)
Platelets: 171 10*3/uL (ref 150–400)
RBC: 2.98 MIL/uL — ABNORMAL LOW (ref 3.87–5.11)
RDW: 14 % (ref 11.5–15.5)
WBC: 7.2 10*3/uL (ref 4.0–10.5)
nRBC: 0 % (ref 0.0–0.2)

## 2020-03-27 MED ORDER — BREAST MILK/FORMULA (FOR LABEL PRINTING ONLY): ORAL | Status: DC

## 2020-03-27 MED ORDER — FERROUS SULFATE 325 (65 FE) MG PO TABS
325.0000 mg | ORAL_TABLET | Freq: Two times a day (BID) | ORAL | Status: DC
  Administered 2020-03-27 – 2020-03-28 (×2): 325 mg via ORAL
  Filled 2020-03-27 (×2): qty 1

## 2020-03-27 NOTE — Anesthesia Post-op Follow-up Note (Signed)
  Anesthesia Pain Follow-up Note  Patient: Kim Moody  Day #: 1  Date of Follow-up: 03/27/2020 Time: 9:53 AM  Last Vitals:  Vitals:   03/27/20 0502 03/27/20 0918  BP: 105/63 117/77  Pulse: 88 (!) 105  Resp: 20 18  Temp: 36.8 C 36.6 C  SpO2: 96% 95%    Level of Consciousness: alert  Pain: none   Side Effects:None  Catheter Site Exam:clean, dry, no drainage     Plan: D/C from anesthesia care at surgeon's request  Jasa Dundon

## 2020-03-27 NOTE — Anesthesia Postprocedure Evaluation (Signed)
Anesthesia Post Note  Patient: Naveen Lorusso  Procedure(s) Performed: CESAREAN SECTION (N/A )  Patient location during evaluation: Mother Baby Anesthesia Type: Spinal Level of consciousness: oriented and awake and alert Pain management: pain level controlled Vital Signs Assessment: post-procedure vital signs reviewed and stable Respiratory status: spontaneous breathing and respiratory function stable Cardiovascular status: blood pressure returned to baseline and stable Postop Assessment: no headache, no backache, no apparent nausea or vomiting and able to ambulate Anesthetic complications: no   No complications documented.   Last Vitals:  Vitals:   03/27/20 0502 03/27/20 0918  BP: 105/63 117/77  Pulse: 88 (!) 105  Resp: 20 18  Temp: 36.8 C 36.6 C  SpO2: 96% 95%    Last Pain:  Vitals:   03/27/20 0918  TempSrc: Oral  PainSc:                  Jeanine Luz

## 2020-03-27 NOTE — Progress Notes (Addendum)
RN assisted patient with initiating breast pumping. Pt tolerated pumping and 2.16ml of breast milk was collected in a storage container with breast milk label on it. Pt has breast milk labels in her room and RN put labeled milk on ice. Pt educated about breast care and instructed to pump every 2 hours if possible to increase her milk supply.

## 2020-03-27 NOTE — Progress Notes (Signed)
Post Partum Day 1  Subjective: Doing well, no concerns. Ambulating without difficulty, pain managed with PO meds, tolerating regular diet, and voiding without difficulty.   No fever/chills, chest pain, shortness of breath, nausea/vomiting, or leg pain. No nipple or breast pain.   Objective: BP 105/63   Pulse 88   Temp 98.2 F (36.8 C) (Oral)   Resp 20   Ht 5\' 1"  (1.549 m)   Wt 63.5 kg   SpO2 96%   Breastfeeding Unknown   BMI 26.45 kg/m    Physical Exam:  General: alert and cooperative Breasts: soft/nontender CV: RRR Pulm: nl effort Abdomen: soft, non-tender Uterine Fundus: firm Incision: healing well Perineum: intact Lochia: appropriate DVT Evaluation: No evidence of DVT seen on physical exam. Edinburgh: No flowsheet data found.   Recent Labs    03/26/20 1710 03/27/20 0431  HGB 9.6* 9.0*  HCT 29.3* 26.9*  WBC 9.3 7.2  PLT 194 171    Assessment/Plan: 24 y.o. G1P0101 postpartum day # 1  -Continue routine postpartum care -Lactation consult PRN for breastfeeding - baby in NICU in [place]  -Acute blood loss anemia - hemodynamically stable and asymptomatic; start PO ferrous sulfate BID with stool softeners    Disposition: Continue inpatient postpartum care    LOS: 1 day   Rodgers Likes, CNM 03/27/2020, 8:37 AM

## 2020-03-28 ENCOUNTER — Encounter: Payer: Self-pay | Admitting: Obstetrics & Gynecology

## 2020-03-28 MED ORDER — PRENATAL MULTIVITAMIN CH: 1.0000 | ORAL_TABLET | Freq: Every day | ORAL | Status: AC

## 2020-03-28 MED ORDER — IBUPROFEN 600 MG PO TABS: 600.0000 mg | ORAL_TABLET | Freq: Four times a day (QID) | ORAL | 0 refills | Status: AC | PRN

## 2020-03-28 MED ORDER — ACETAMINOPHEN 500 MG PO TABS: 1000.0000 mg | ORAL_TABLET | Freq: Four times a day (QID) | ORAL | 0 refills | Status: AC | PRN

## 2020-03-28 MED ORDER — OXYCODONE HCL 5 MG PO TABS: 5.0000 mg | ORAL_TABLET | ORAL | 0 refills | Status: AC | PRN

## 2020-03-28 NOTE — TOC Transition Note (Signed)
Transition of Care [facility]) - CM/SW Discharge Note   Patient Details  Name: Kim Moody MRN: [id number] Date of Birth: 1995/08/17  [facility]) CM/SW Contact:   Cellar, RN Phone Number: 03/28/2020, 10:37 AM   Clinical Narrative:     Patient will discharge home with FOB and grandmother of infant. Infant remains in NICU in [place].          Patient Goals and CMS Choice        Discharge Placement                       Discharge Plan and Services                                     Social Determinants of Health (SDOH) Interventions     Readmission Risk Interventions No flowsheet data found.

## 2020-03-28 NOTE — Plan of Care (Signed)
Patient Appropriate for discharge  ?

## 2020-03-28 NOTE — TOC Initial Note (Signed)
Transition of Care [facility]) - Initial/Assessment Note    Patient Details  Name: Kim Moody MRN: [id number] Date of Birth: 1996/01/14  Transition [facility] Care [facility]) CM/SW Contact:    [place] Cellar, RN Phone Number: 03/28/2020, 10:30 AM  Clinical Narrative:                  Spoke with patient via telephone due to isolation precautions. Patient reports feeling sad because her infant is at NICU in [place] and not with her but she understands the baby was very early and needs special care. Mother and father both have COVID at this time and are unable to visit in person however do have personal carelink to be able to monitor infant 24/7. TOC RN CM discussed in detail PPD/baby blues and the importance of strong support system. Patient lives with her boyfriend and his mother who assist her as needed. Patient reports having history of depression and is followed closely by her OB/GYN. Current plan is for breastfeeding/pumping. 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 Patient reports she has not had a chance to obtain car seat or select pediatrician due to COVID and premature birth however plans to do so once she is off quarantine. Advised patient to talk with NICU staff about selection of car seats once it gets closer to discharge date due to possible special needs with size. Patient reports her support system will be able to provide transportation to and from hospital to visit although they are hopeful infant will be moved to [place] due to them living in [place]. Family is working with medical staff on setting that up. No other needs or concerns at this time. Patient is very appreciative of assistance.         Patient Goals and CMS Choice        Expected Discharge Plan and Services           Expected Discharge Date: 03/28/20                                    Prior Living Arrangements/Services                       Activities of Daily Living Home Assistive  Devices/Equipment: None ADL Screening (condition at time of admission) Patient's cognitive ability adequate to safely complete daily activities?: Yes Is the patient deaf or have difficulty hearing?: No Does the patient have difficulty seeing, even when wearing glasses/contacts?: No Does the patient have difficulty concentrating, remembering, or making decisions?: No Patient able to express need for assistance with ADLs?: Yes Does the patient have difficulty dressing or bathing?: No Independently performs ADLs?: Yes (appropriate for developmental age) Does the patient have difficulty walking or climbing stairs?: No Weakness of Legs: None Weakness of Arms/Hands: None  Permission Sought/Granted                  Emotional Assessment              Admission diagnosis:  Vaginal bleeding in pregnancy, third trimester [O46.93] Preterm labor [O60.00] Indication for care in labor or delivery [O75.9] Patient Active Problem List   Diagnosis Date Noted  . Vaginal bleeding in pregnancy, third trimester 03/26/2020  . COVID-19 affecting pregnancy in third trimester 03/26/2020  . Preterm labor 03/26/2020  . Indication for care in labor or delivery 03/26/2020  . Vaginal bleeding in pregnancy,  second trimester    PCP:  Patient, No Pcp Per Pharmacy:   [id number] [place], [place] - [address] AT [facility] OF [address] [place] [address] Phone: [phone] Fax: [phone]     Social Determinants of Health (SDOH) Interventions    Readmission Risk Interventions No flowsheet data found.

## 2020-03-28 NOTE — Discharge Instructions (Signed)
° °Cesarean Delivery, Care After °Refer to this sheet in the next few weeks. These instructions provide you with information on caring for yourself after your procedure. Your health care provider may also give you specific instructions. Your treatment has been planned according to current medical practices, but problems sometimes occur. Call your health care provider if you have any problems or questions after you go home. °HOME CARE INSTRUCTIONS  °· If you have an On-Q pump, remove it on the 5th day after your surgery, by removing the dressing/bandage and pulling the pump out. Cover the site where the pump strings came out with a band-aid, as needed. °· Only take over-the-counter or prescription medications as directed by your health care provider. °· Do not drink alcohol, especially if you are breastfeeding or taking medication to relieve pain. °· Do not  smoke tobacco. °· Continue to use good perineal care. Good perineal care includes: °¨ Wiping your perineum from front to back. °¨ Keeping your perineum clean. °· Check your surgical cut (incision) daily for increased redness, drainage, swelling, or separation of skin. °· Shower and clean your incision gently with soap and water every day, by letting warm and soapy water run over the incision, and then pat it dry. If your health care provider says it is okay, leave the incision uncovered. Use a bandage (dressing) if the incision is draining fluid or appears irritated. If the adhesive strips across the incision do not fall off within 7 days, carefully peel them off, after a shower. °· Hug a pillow when coughing or sneezing until your incision is healed. This helps to relieve pain. °· Do not use tampons, douches or have sexual intercourse, until your health care provider says it is okay. °· Wear a well-fitting bra that provides breast support. °· Limit wearing support panties or control-top hose. °· Drink enough fluids to keep your urine clear or pale  yellow. °· Eat high-fiber foods such as whole grain cereals and breads, brown rice, beans, and fresh fruits and vegetables every day. These foods may help prevent or relieve constipation. °· Resume activities such as climbing stairs, driving, lifting, exercising, or traveling as directed by your health care provider. °· Try to have someone help you with your household activities and your newborn for at least a few days after you leave the hospital. °· Rest as much as possible. Try to rest or take a nap when your newborn is sleeping. °· Increase your activities gradually. °· Do not lift more than 15lbs until directed by a provider. °· Keep all of your scheduled postpartum appointments. It is very important to keep your scheduled follow-up appointments. At these appointments, your health care provider will be checking to make sure that you are healing physically and emotionally. °SEEK MEDICAL CARE IF:  °· You are passing large clots from your vagina. Save any clots to show your health care provider. °· You have a foul smelling discharge from your vagina. °· You have trouble urinating. °· You are urinating frequently. °· You have pain when you urinate. °· You have a change in your bowel movements. °· You have increasing redness, pain, or swelling near your incision. °· You have pus draining from your incision. °· Your incision is separating. °· You have painful, hard, or reddened breasts. °· You have a severe headache. °· You have blurred vision or see spots. °· You feel sad or depressed. °· You have thoughts of hurting yourself or your newborn. °· You have questions about your   care, the care of your newborn, or medications. °· You are dizzy or light-headed. °· You have a rash. °· You have pain, redness, or swelling at the site of the removed intravenous access (IV) tube. °· You have nausea or vomiting. °· You stopped breastfeeding and have not had a menstrual period within 12 weeks of stopping. °· You are not  breastfeeding and have not had a menstrual period within 12 weeks of delivery. °· You have a fever. °SEEK IMMEDIATE MEDICAL CARE IF: °· You have persistent pain. °· You have chest pain. °· You have shortness of breath. °· You faint. °· You have leg pain. °· You have stomach pain. °· Your vaginal bleeding saturates 2 or more sanitary pads in 1 hour. °MAKE SURE YOU:  °· Understand these instructions. °· Will watch your condition. °· Will get help right away if you are not doing well or get worse. °Document Released: 12/05/2001 Document Revised: 07/30/2013 Document Reviewed: 11/10/2011 °ExitCare® Patient Information ©2015 [employer]. This information is not intended to replace advice given to you by your health care provider. Make sure you discuss any questions you have with your health care provider. ° ° °

## 2020-03-31 LAB — SURGICAL PATHOLOGY

## 2020-04-04 ENCOUNTER — Encounter: Payer: Self-pay | Admitting: Obstetrics & Gynecology

## 2020-04-04 DIAGNOSIS — T8131XA Disruption of external operation (surgical) wound, not elsewhere classified, initial encounter: Secondary | ICD-10-CM | POA: Diagnosis not present

## 2020-04-04 DIAGNOSIS — O900 Disruption of cesarean delivery wound: Secondary | ICD-10-CM | POA: Diagnosis not present

## 2020-04-07 DIAGNOSIS — O900 Disruption of cesarean delivery wound: Secondary | ICD-10-CM | POA: Diagnosis not present

## 2020-04-09 DIAGNOSIS — O900 Disruption of cesarean delivery wound: Secondary | ICD-10-CM | POA: Diagnosis not present

## 2020-04-23 DIAGNOSIS — Z3042 Encounter for surveillance of injectable contraceptive: Secondary | ICD-10-CM | POA: Diagnosis not present

## 2020-04-23 DIAGNOSIS — Z3202 Encounter for pregnancy test, result negative: Secondary | ICD-10-CM | POA: Diagnosis not present

## 2020-05-07 DIAGNOSIS — Z1332 Encounter for screening for maternal depression: Secondary | ICD-10-CM | POA: Diagnosis not present

## 2020-07-27 DIAGNOSIS — Z419 Encounter for procedure for purposes other than remedying health state, unspecified: Secondary | ICD-10-CM | POA: Diagnosis not present

## 2020-08-26 ENCOUNTER — Other Ambulatory Visit: Payer: Self-pay

## 2020-08-26 ENCOUNTER — Emergency Department (HOSPITAL_COMMUNITY)
Admission: EM | Admit: 2020-08-26 | Discharge: 2020-08-26 | Discharge status: Against Medical Advice | Payer: Medicaid Other | Attending: Physician Assistant | Admitting: Physician Assistant

## 2020-08-26 ENCOUNTER — Encounter (HOSPITAL_COMMUNITY): Payer: Self-pay | Admitting: Emergency Medicine

## 2020-08-26 DIAGNOSIS — R55 Syncope and collapse: Secondary | ICD-10-CM | POA: Insufficient documentation

## 2020-08-26 DIAGNOSIS — R404 Transient alteration of awareness: Secondary | ICD-10-CM | POA: Diagnosis not present

## 2020-08-26 DIAGNOSIS — G4489 Other headache syndrome: Secondary | ICD-10-CM | POA: Diagnosis not present

## 2020-08-26 DIAGNOSIS — E162 Hypoglycemia, unspecified: Secondary | ICD-10-CM | POA: Diagnosis not present

## 2020-08-26 DIAGNOSIS — Z5321 Procedure and treatment not carried out due to patient leaving prior to being seen by health care provider: Secondary | ICD-10-CM | POA: Diagnosis not present

## 2020-08-26 DIAGNOSIS — Z743 Need for continuous supervision: Secondary | ICD-10-CM | POA: Diagnosis not present

## 2020-08-26 DIAGNOSIS — E161 Other hypoglycemia: Secondary | ICD-10-CM | POA: Diagnosis not present

## 2020-08-26 NOTE — ED Provider Notes (Addendum)
Emergency Medicine Provider Triage Evaluation Note  Kim Moody , a 25 y.o. female  was evaluated in triage.  Pt complains of syncope x2 episodes today.  States that she was at a baseball game when she felt lightheaded and passed out.  She was told that she passed out again.  Mitts that she do not eat anything today as she has been busy with a newborn baby the whole day.  Has been having a headache since the syncopal episode.  No preceding or current chest pain, vision changes, anticoagulant use.  Review of Systems  Positive: Headache, syncope Negative: Chest pain, vision changes  Physical Exam  BP 123/79 (BP Location: Right Arm)   Pulse 79   Temp 98.8 F (37.1 C) (Oral)   Resp 17   SpO2 100%  Gen:   Awake, no distress   Resp:  Normal effort  MSK:   Moves extremities without difficulty  Other:  No facial asymmetry, moving all extremities without difficulty  Medical Decision Making  Medically screening exam initiated at 8:29 PM.  Appropriate orders placed.  Kim Moody was informed that the remainder of the evaluation will be completed by another provider, this initial triage assessment does not replace that evaluation, and the importance of remaining in the ED until their evaluation is complete.  Will obtain lab work, EKG   I was informed by tech that patient wants to leave.  She states that she does not want to stay here as there is "a lot going on here, I will just go to the urgent care in the morning."  Informed her that her symptoms could worsen without a full evaluation here.  She understands this.  I told her that she can return at any time to seek treatment here or any other ER she ambulated out of the ED without any distress. I removed patient's IV prior to her leaving       Dietrich Pates, PA-C 08/26/20 2056    Terald Sleeper, MD 08/27/20 1109

## 2020-08-26 NOTE — ED Triage Notes (Signed)
Pt arrived via EMS from the [employer]. Pt had 2 syncopal episodes at the game, once in front of her family and once in front of the paramedic at the game. Pt has hx of anemia and has not taken her iron pills. Pt also has not eaten today. EMS reports that pt hit her head when she had her first syncopal episode but that she has no neurological deficits.

## 2020-08-27 ENCOUNTER — Other Ambulatory Visit: Payer: Self-pay

## 2020-08-27 ENCOUNTER — Encounter: Payer: Self-pay | Admitting: Emergency Medicine

## 2020-08-27 ENCOUNTER — Emergency Department
Admission: EM | Admit: 2020-08-27 | Discharge: 2020-08-27 | Disposition: A | Discharge status: Home / Self Care | Payer: Medicaid Other | Attending: Emergency Medicine | Admitting: Emergency Medicine

## 2020-08-27 ENCOUNTER — Emergency Department: Payer: Medicaid Other

## 2020-08-27 DIAGNOSIS — R55 Syncope and collapse: Secondary | ICD-10-CM | POA: Diagnosis not present

## 2020-08-27 DIAGNOSIS — S060X1A Concussion with loss of consciousness of 30 minutes or less, initial encounter: Secondary | ICD-10-CM | POA: Diagnosis not present

## 2020-08-27 DIAGNOSIS — S0990XA Unspecified injury of head, initial encounter: Secondary | ICD-10-CM | POA: Diagnosis not present

## 2020-08-27 DIAGNOSIS — X58XXXA Exposure to other specified factors, initial encounter: Secondary | ICD-10-CM | POA: Diagnosis not present

## 2020-08-27 DIAGNOSIS — Z419 Encounter for procedure for purposes other than remedying health state, unspecified: Secondary | ICD-10-CM | POA: Diagnosis not present

## 2020-08-27 DIAGNOSIS — R519 Headache, unspecified: Secondary | ICD-10-CM | POA: Diagnosis not present

## 2020-08-27 LAB — BASIC METABOLIC PANEL
Anion gap: 5 (ref 5–15)
BUN: 9 mg/dL (ref 6–20)
CO2: 24 mmol/L (ref 22–32)
Calcium: 9.1 mg/dL (ref 8.9–10.3)
Chloride: 108 mmol/L (ref 98–111)
Creatinine, Ser: 0.72 mg/dL (ref 0.44–1.00)
GFR, Estimated: >60 mL/min (ref >60)
Glucose, Bld: 99 mg/dL (ref 70–99)
Potassium: 4.5 mmol/L (ref 3.5–5.1)
Sodium: 137 mmol/L (ref 135–145)

## 2020-08-27 LAB — CBC
HCT: 40.7 % (ref 36.0–46.0)
Hemoglobin: 13.4 g/dL (ref 12.0–15.0)
MCH: 29.2 pg (ref 26.0–34.0)
MCHC: 32.9 g/dL (ref 30.0–36.0)
MCV: 88.7 fL (ref 80.0–100.0)
Platelets: 284 10*3/uL (ref 150–400)
RBC: 4.59 MIL/uL (ref 3.87–5.11)
RDW: 13.8 % (ref 11.5–15.5)
WBC: 6.1 10*3/uL (ref 4.0–10.5)
nRBC: 0 % (ref 0.0–0.2)

## 2020-08-27 LAB — POC URINE PREG, ED: Preg Test, Ur: NEGATIVE

## 2020-08-27 NOTE — ED Provider Notes (Signed)
[facility] Emergency Department Provider Note  Time seen: 11:17 AM  I have reviewed the triage vital signs and the nursing notes.   HISTORY  Chief Complaint Loss of Consciousness   HPI Kim Moody is a 25 y.o. female with a past medical history of PCOS, presents to the emergency department for syncopal episode.  According to the patient yesterday she was at a baseball game when she passed out around 7 PM hitting her head.  Patient states she has had a headache ever since she was nauseated shortly after the head injury she went to the emergency department but states due to a long wait did not wait for the CT scan.  As the patient continued to have a mild to moderate headache today she decided to return mostly for the CT scan.  Patient denies any weakness or numbness of any arm or leg denies any confusion denies any vomiting although states intermittent nausea.  Patient states she has passed out in the past as well.  No chest pain.  Largely negative review of systems.   Past Medical History:  Diagnosis Date  . 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 PCOS (polycystic ovarian syndrome)   . PCOS (polycystic ovarian syndrome)   . PTSD (post-traumatic stress disorder) 11/07/2017    Patient Active Problem List   Diagnosis Date Noted  . Vaginal bleeding in pregnancy, third trimester 03/26/2020  . COVID-19 affecting pregnancy in third trimester 03/26/2020  . Preterm labor 03/26/2020  . Indication for care in labor or delivery 03/26/2020  . Vaginal bleeding in pregnancy, second trimester     Past Surgical History:  Procedure Laterality Date  . CESAREAN SECTION N/A 03/26/2020   Procedure: CESAREAN SECTION;  Surgeon: Ward, Elenora Fender, MD;  Location: [facility] ORS;  Service: Obstetrics;  Laterality: N/A;    Prior to Admission medications   Medication Sig Start Date End Date Taking? Authorizing Provider  acetaminophen (TYLENOL) 500 MG tablet  Take 2 tablets (1,000 mg total) by mouth every 6 (six) hours as needed. 03/28/20   Gustavo Lah, CNM  ibuprofen (ADVIL) 600 MG tablet Take 1 tablet (600 mg total) by mouth every 6 (six) hours as needed for fever or headache. 03/28/20   Gustavo Lah, CNM  oxyCODONE (OXY IR/ROXICODONE) 5 MG immediate release tablet Take 1-2 tablets (5-10 mg total) by mouth every 4 (four) hours as needed for moderate pain. 03/28/20   Gustavo Lah, CNM  Prenatal Vit-Fe Fumarate-FA (PRENATAL MULTIVITAMIN) TABS tablet Take 1 tablet by mouth daily at 12 noon. 03/28/20   Gustavo Lah, CNM    No Known Allergies  History reviewed. No pertinent family history.  Social History Social History   Tobacco Use  . Smoking status: Never Smoker  . Smokeless tobacco: Never Used  Substance Use Topics  . Alcohol use: Not Currently  . Drug use: Never    Review of Systems Constitutional: Negative for fever.  Positive for LOC. Cardiovascular: Negative for chest pain. Respiratory: Negative for shortness of breath.  Negative for cough. Gastrointestinal: Negative for abdominal pain, vomiting  Genitourinary: Negative for urinary compaints Musculoskeletal: Negative for musculoskeletal complaints Neurological: Negative for headache All other ROS negative  ____________________________________________   PHYSICAL EXAM:  VITAL SIGNS: ED Triage Vitals  Enc Vitals Group     BP 08/27/20 1003 120/81     Pulse Rate 08/27/20 1003 71     Resp 08/27/20 1003 17     Temp 08/27/20  1003 98.5 F (36.9 C)     Temp Source 08/27/20 1003 Oral     SpO2 08/27/20 1003 99 %     Weight 08/27/20 1006 138 lb 14.2 oz (63 kg)     Height 08/27/20 1006 5\' 2"  (1.575 m)     Head Circumference --      Peak Flow --      Pain Score 08/27/20 1006 6     Pain Loc --      Pain Edu? --      Excl. in GC? --     Constitutional: Alert and oriented. Well appearing and in no distress. Eyes: Normal exam ENT      Head: Normocephalic and  atraumatic.      Mouth/Throat: Mucous membranes are moist. Cardiovascular: Normal rate, regular rhythm.  Respiratory: Normal respiratory effort without tachypnea nor retractions. Breath sounds are clear Gastrointestinal: Soft and nontender. No distention.   Musculoskeletal: Nontender with normal range of motion in all extremities. Neurologic:  Normal speech and language. No gross focal neurologic deficits Skin:  Skin is warm, dry and intact.  Psychiatric: Mood and affect are normal.   ____________________________________________    EKG  EKG viewed and interpreted by myself shows a normal sinus rhythm at 71 bpm with a narrow QRS, normal axis, normal intervals, no concerning ST changes.  ____________________________________________    RADIOLOGY  CT scan is negative for acute abnormality  ____________________________________________   INITIAL IMPRESSION / ASSESSMENT AND PLAN / ED COURSE  Pertinent labs & imaging results that were available during my care of the patient were reviewed by me and considered in my medical decision making (see chart for details).   Patient presents emergency department after a head injury and syncopal episode last night.  Overall the patient appears well, reassuring physical exam, reassuring vitals.  Largely negative review of systems.  Patient's lab work and CT scan are largely normal.  No intracranial abnormality.  We will check a urine pregnancy test as a precaution.  As long as the patient's urine pregnancy test is negative anticipate discharge home with PCP follow-up.  Discussed with the patient drinking plenty of fluids obtaining plenty of rest and not exerting herself until she is 100% back to her baseline.  Patient's pregnancy test is negative.  Lab work is reassuring.  Kim Moody was evaluated in Emergency Department on 08/27/2020 for the symptoms described in the history of present illness. She was evaluated in the context of the global COVID-19  pandemic, which necessitated consideration that the patient might be at risk for infection with the SARS-CoV-2 virus that causes COVID-19. Institutional protocols and algorithms that pertain to the evaluation of patients at risk for COVID-19 are in a state of rapid change based on information released by regulatory bodies including the CDC and federal and state organizations. These policies and algorithms were followed during the patient's care in the ED.  ____________________________________________   FINAL CLINICAL IMPRESSION(S) / ED DIAGNOSES  Head injury Syncope   10/27/2020, MD 08/27/20 1216

## 2020-08-27 NOTE — ED Triage Notes (Signed)
Pt comes into the ED via POV c/o syncopal episode yesterday while at a [employer] game.  PT was seen at [facility] long and saw the physician but did not stay for CT head or further exam because she didn't want to wait in the lobby.  Pt states she did hit her head when she had LOC.  Pt denies any blood thinner use.  Pt currently neurologically intact. Pt has a h/o anemia and has not been taking her iron pills.

## 2020-09-26 DIAGNOSIS — Z419 Encounter for procedure for purposes other than remedying health state, unspecified: Secondary | ICD-10-CM | POA: Diagnosis not present

## 2020-10-10 DIAGNOSIS — Z3042 Encounter for surveillance of injectable contraceptive: Secondary | ICD-10-CM | POA: Diagnosis not present

## 2020-10-27 DIAGNOSIS — Z419 Encounter for procedure for purposes other than remedying health state, unspecified: Secondary | ICD-10-CM | POA: Diagnosis not present

## 2020-11-27 DIAGNOSIS — Z419 Encounter for procedure for purposes other than remedying health state, unspecified: Secondary | ICD-10-CM | POA: Diagnosis not present

## 2020-12-26 DIAGNOSIS — Z3202 Encounter for pregnancy test, result negative: Secondary | ICD-10-CM | POA: Diagnosis not present

## 2020-12-26 DIAGNOSIS — Z3042 Encounter for surveillance of injectable contraceptive: Secondary | ICD-10-CM | POA: Diagnosis not present

## 2020-12-27 DIAGNOSIS — Z419 Encounter for procedure for purposes other than remedying health state, unspecified: Secondary | ICD-10-CM | POA: Diagnosis not present

## 2021-01-27 DIAGNOSIS — Z419 Encounter for procedure for purposes other than remedying health state, unspecified: Secondary | ICD-10-CM | POA: Diagnosis not present

## 2021-02-26 DIAGNOSIS — Z419 Encounter for procedure for purposes other than remedying health state, unspecified: Secondary | ICD-10-CM | POA: Diagnosis not present

## 2021-03-12 DIAGNOSIS — G44301 Post-traumatic headache, unspecified, intractable: Secondary | ICD-10-CM | POA: Diagnosis not present

## 2021-03-12 DIAGNOSIS — Z87898 Personal history of other specified conditions: Secondary | ICD-10-CM | POA: Diagnosis not present

## 2021-03-13 DIAGNOSIS — Z3042 Encounter for surveillance of injectable contraceptive: Secondary | ICD-10-CM | POA: Diagnosis not present

## 2021-03-26 DIAGNOSIS — Z20822 Contact with and (suspected) exposure to covid-19: Secondary | ICD-10-CM | POA: Diagnosis not present

## 2021-03-29 DIAGNOSIS — Z419 Encounter for procedure for purposes other than remedying health state, unspecified: Secondary | ICD-10-CM | POA: Diagnosis not present

## 2021-04-13 DIAGNOSIS — R55 Syncope and collapse: Secondary | ICD-10-CM | POA: Diagnosis not present

## 2021-04-14 DIAGNOSIS — Z87820 Personal history of traumatic brain injury: Secondary | ICD-10-CM | POA: Diagnosis not present

## 2021-04-14 DIAGNOSIS — G4489 Other headache syndrome: Secondary | ICD-10-CM | POA: Diagnosis not present

## 2021-04-14 DIAGNOSIS — H53149 Visual discomfort, unspecified: Secondary | ICD-10-CM | POA: Diagnosis not present

## 2021-04-14 DIAGNOSIS — F40298 Other specified phobia: Secondary | ICD-10-CM | POA: Diagnosis not present

## 2021-04-29 DIAGNOSIS — Z419 Encounter for procedure for purposes other than remedying health state, unspecified: Secondary | ICD-10-CM | POA: Diagnosis not present

## 2021-05-21 DIAGNOSIS — R55 Syncope and collapse: Secondary | ICD-10-CM | POA: Diagnosis not present

## 2021-05-27 DIAGNOSIS — Z419 Encounter for procedure for purposes other than remedying health state, unspecified: Secondary | ICD-10-CM | POA: Diagnosis not present

## 2021-05-29 DIAGNOSIS — Z3042 Encounter for surveillance of injectable contraceptive: Secondary | ICD-10-CM | POA: Diagnosis not present

## 2021-06-01 DIAGNOSIS — R55 Syncope and collapse: Secondary | ICD-10-CM | POA: Diagnosis not present

## 2021-06-27 DIAGNOSIS — Z419 Encounter for procedure for purposes other than remedying health state, unspecified: Secondary | ICD-10-CM | POA: Diagnosis not present

## 2021-07-16 DIAGNOSIS — Z3042 Encounter for surveillance of injectable contraceptive: Secondary | ICD-10-CM | POA: Diagnosis not present

## 2021-07-16 DIAGNOSIS — Z01419 Encounter for gynecological examination (general) (routine) without abnormal findings: Secondary | ICD-10-CM | POA: Diagnosis not present

## 2021-07-16 DIAGNOSIS — Z8669 Personal history of other diseases of the nervous system and sense organs: Secondary | ICD-10-CM | POA: Diagnosis not present

## 2021-07-27 DIAGNOSIS — Z419 Encounter for procedure for purposes other than remedying health state, unspecified: Secondary | ICD-10-CM | POA: Diagnosis not present

## 2021-08-20 DIAGNOSIS — Z3042 Encounter for surveillance of injectable contraceptive: Secondary | ICD-10-CM | POA: Diagnosis not present

## 2021-08-27 DIAGNOSIS — Z419 Encounter for procedure for purposes other than remedying health state, unspecified: Secondary | ICD-10-CM | POA: Diagnosis not present

## 2021-09-26 DIAGNOSIS — Z419 Encounter for procedure for purposes other than remedying health state, unspecified: Secondary | ICD-10-CM | POA: Diagnosis not present

## 2021-10-27 DIAGNOSIS — Z419 Encounter for procedure for purposes other than remedying health state, unspecified: Secondary | ICD-10-CM | POA: Diagnosis not present

## 2021-11-27 DIAGNOSIS — Z419 Encounter for procedure for purposes other than remedying health state, unspecified: Secondary | ICD-10-CM | POA: Diagnosis not present

## 2021-12-27 DIAGNOSIS — Z419 Encounter for procedure for purposes other than remedying health state, unspecified: Secondary | ICD-10-CM | POA: Diagnosis not present

## 2022-01-27 DIAGNOSIS — Z419 Encounter for procedure for purposes other than remedying health state, unspecified: Secondary | ICD-10-CM | POA: Diagnosis not present

## 2022-02-26 DIAGNOSIS — Z419 Encounter for procedure for purposes other than remedying health state, unspecified: Secondary | ICD-10-CM | POA: Diagnosis not present

## 2022-06-16 IMAGING — CT CT HEAD W/O CM
3 series · 16 of 46 positions shown, 19 images · non-contrast
Comparison: No pertinent prior exams available for comparison.

CLINICAL DATA: Provided history: Head trauma, loss of
consciousness. Additional history provided: Syncopal episode last
night (hitting back of head), headache.

EXAM:
CT HEAD WITHOUT CONTRAST
TECHNIQUE: Contiguous axial images were obtained from the base of the skull
through the vertex without intravenous contrast.

[Series 2: head wo · axial · 0.40mm/px · z∈[+109,+229]mm · 10 of 29 slices shown, 13 images]
[im 3/29  brain]
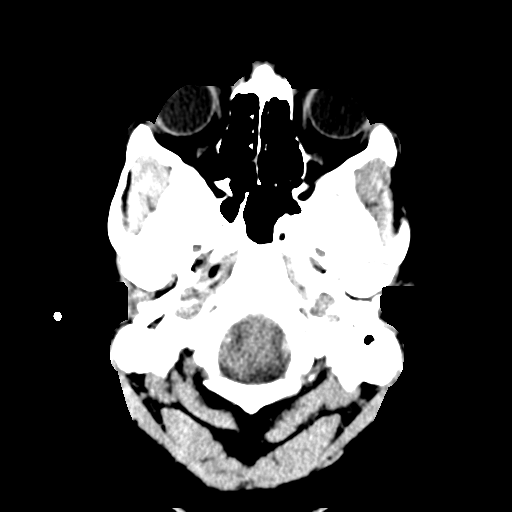
[im 3/29  bone]
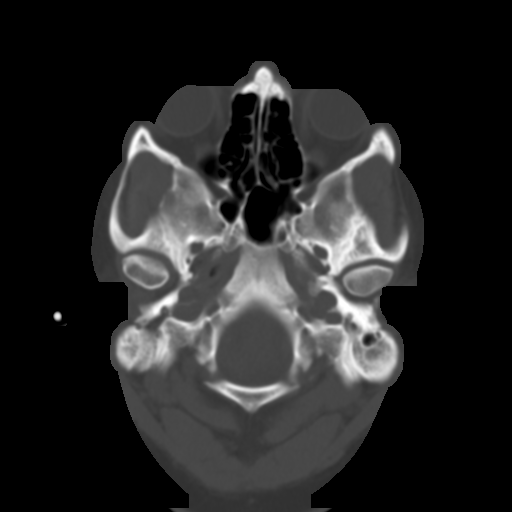
[im 6/29  brain]
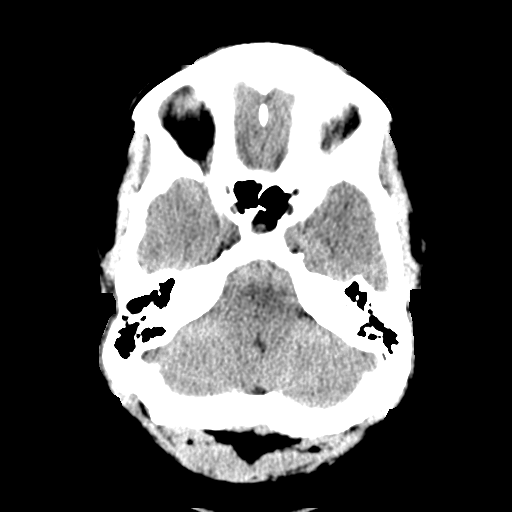
[im 8/29  brain]
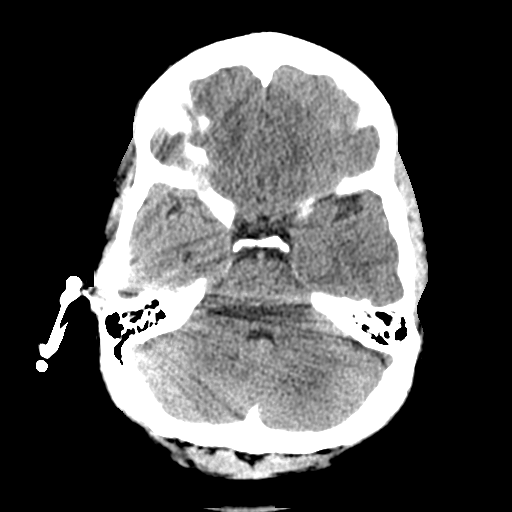
[im 11/29  brain]
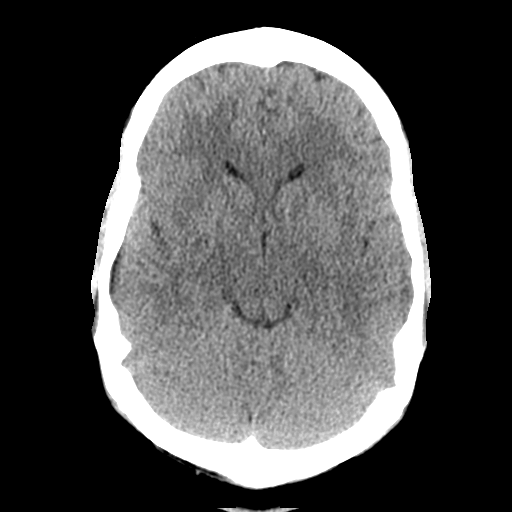
[im 14/29  brain]
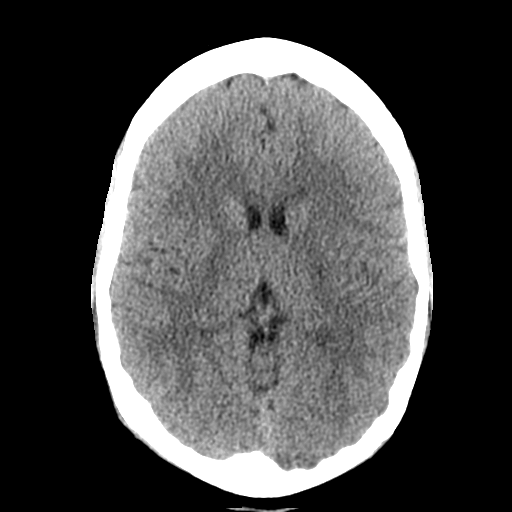
[im 14/29  bone]
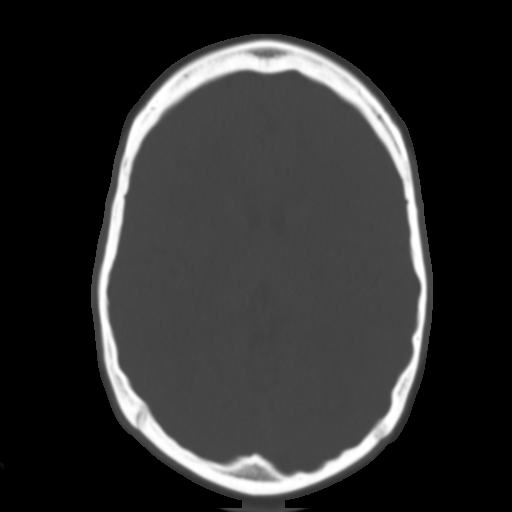
[im 16/29  brain]
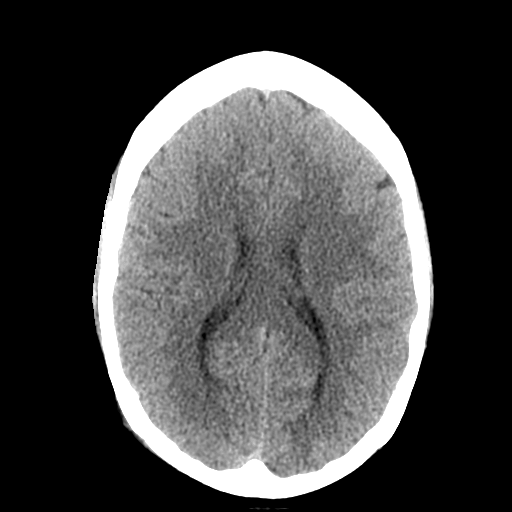
[im 19/29  brain]
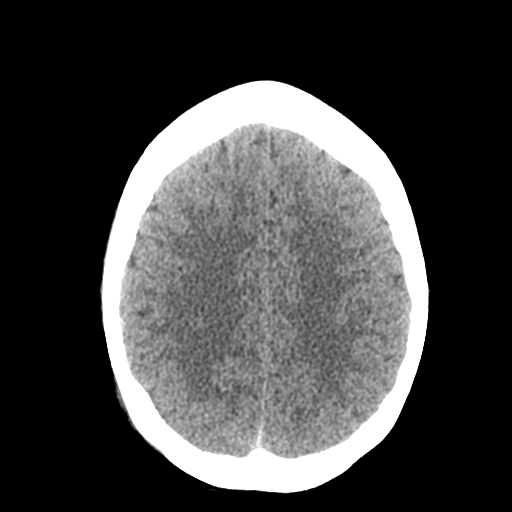
[im 22/29  brain]
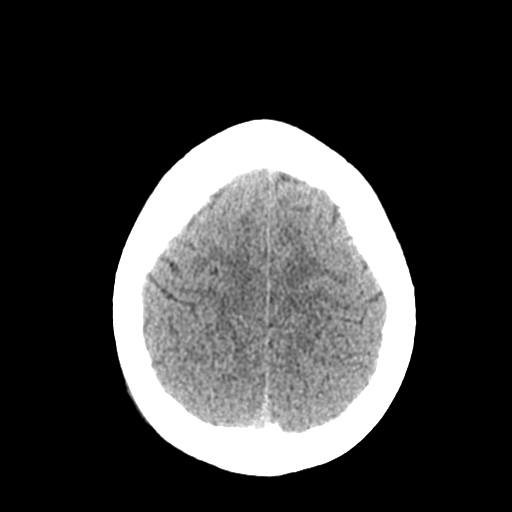
[im 24/29  brain]
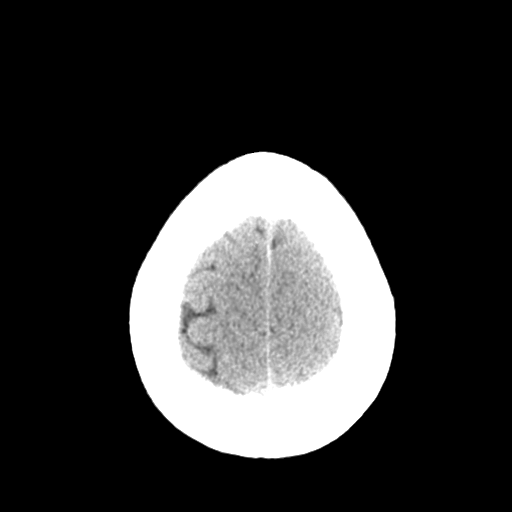
[im 24/29  bone]
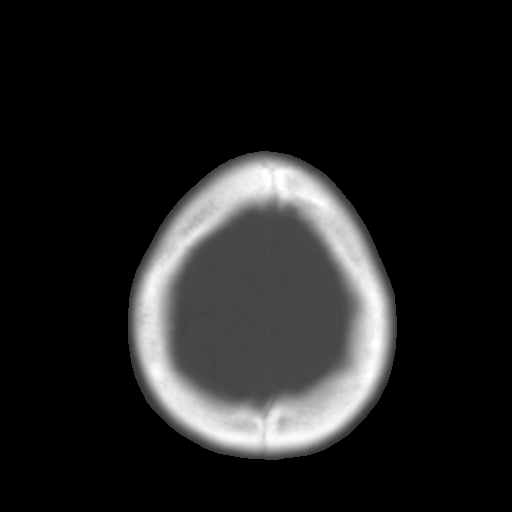
[im 27/29  brain]
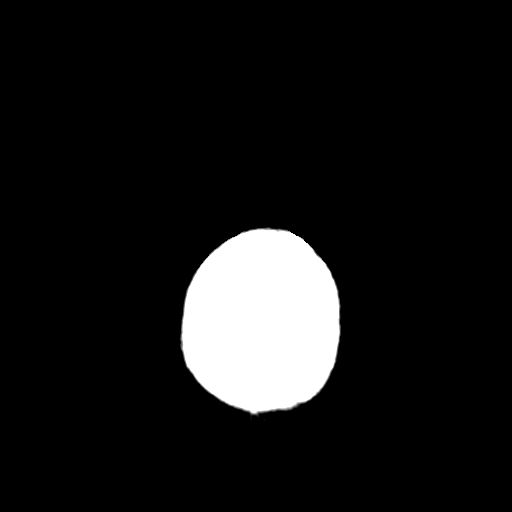

[Series 4: coronal soft tissue · coronal · 0.30mm/px · 3 of 65 slices shown]
[im 22/65  brain]
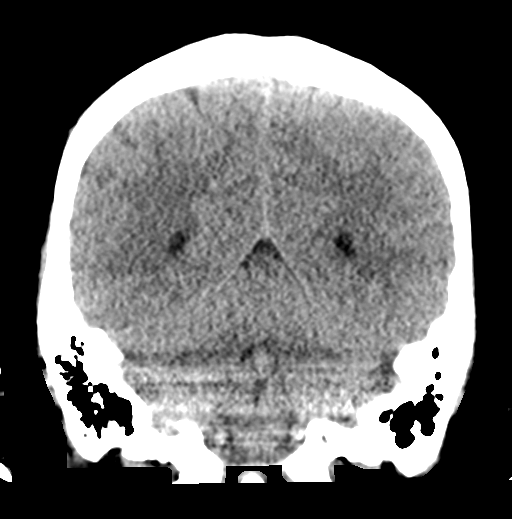
[im 29/65  brain]
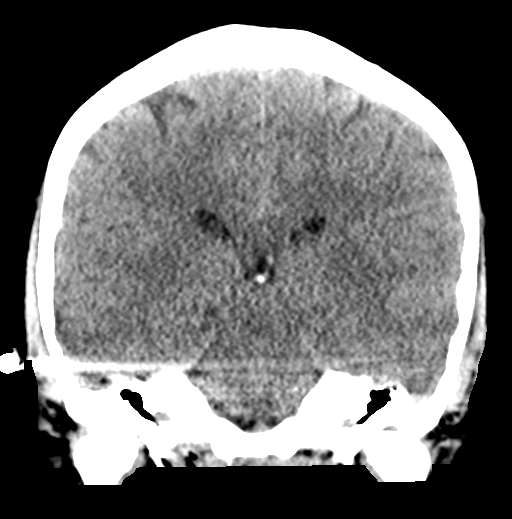
[im 36/65  brain]
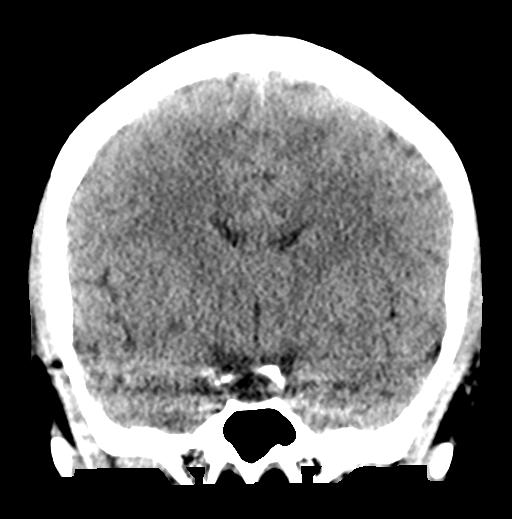

[Series 5: sagittal soft tissue · sagittal · 0.31mm/px · 3 of 53 slices shown]
[im 18/53  brain]
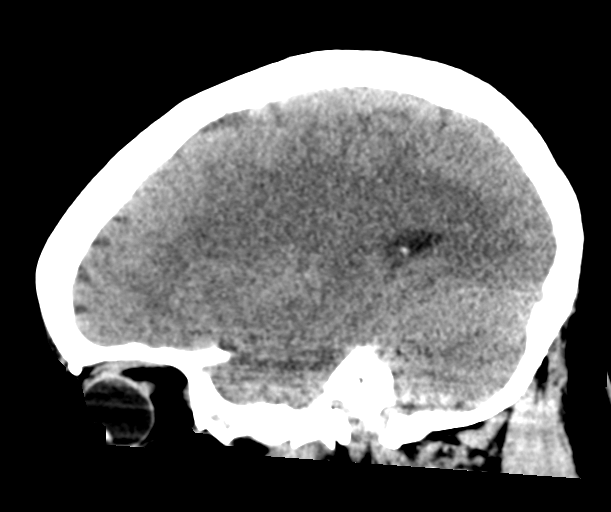
[im 27/53  brain]
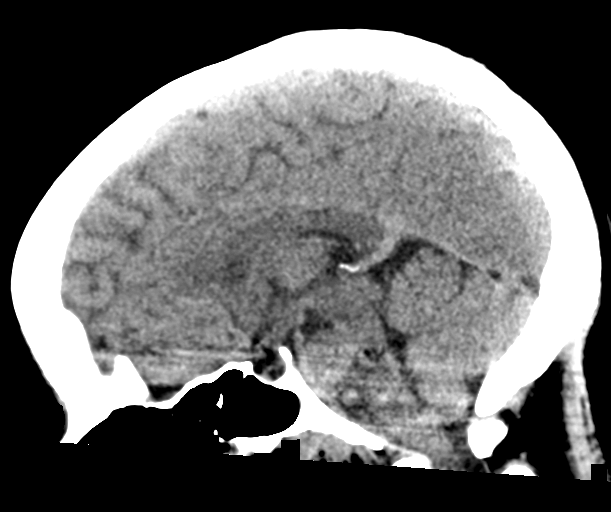
[im 35/53  brain]
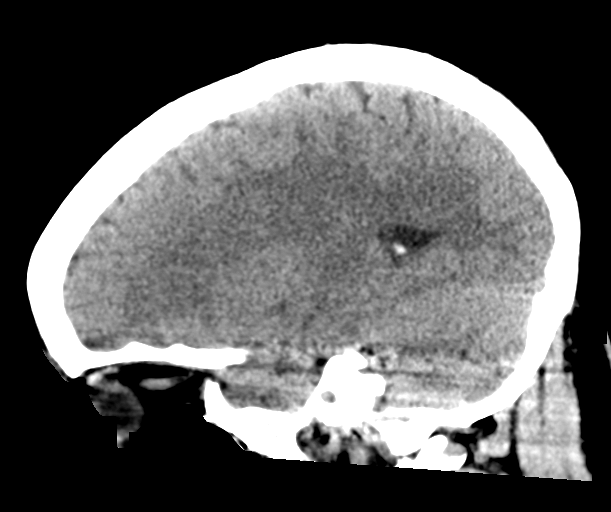

[16 of 46 positions shown; findings below may reference images not displayed]

FINDINGS: Brain:

Cerebral volume is normal.

There is no acute intracranial hemorrhage.

No demarcated cortical infarct.

No extra-axial fluid collection.

No evidence of intracranial mass.

No midline shift.

Vascular: No hyperdense vessel.

Skull: Normal. Negative for fracture or focal lesion.

Sinuses/Orbits: Visualized orbits show no acute finding. Small fluid
level within the left sphenoid sinus.
IMPRESSION: No evidence of acute intracranial abnormality.

Left sphenoid sinusitis.
